# Patient Record
Sex: Male | Born: 1950 | Race: White | Hispanic: No | Marital: Married | Smoking: Former smoker
Health system: Southern US, Community
[De-identification: ages and names within clinical notes are randomized; demographics above are authoritative.]

## PROBLEM LIST (undated history)

## (undated) DIAGNOSIS — I10 Essential (primary) hypertension: Secondary | ICD-10-CM

## (undated) DIAGNOSIS — E785 Hyperlipidemia, unspecified: Secondary | ICD-10-CM

## (undated) HISTORY — DX: Essential (primary) hypertension: I10

## (undated) HISTORY — PX: LEG SURGERY: SHX1003

## (undated) HISTORY — DX: Hyperlipidemia, unspecified: E78.5

---

## 1987-08-01 HISTORY — PX: SPINE SURGERY: SHX786

## 2002-04-07 ENCOUNTER — Inpatient Hospital Stay (HOSPITAL_COMMUNITY): Admission: EM | Admit: 2002-04-07 | Discharge: 2002-04-08 | Payer: Self-pay | Admitting: Emergency Medicine

## 2007-09-24 ENCOUNTER — Encounter: Admission: RE | Admit: 2007-09-24 | Discharge: 2007-09-24 | Payer: Self-pay | Admitting: Neurosurgery

## 2007-10-11 ENCOUNTER — Inpatient Hospital Stay (HOSPITAL_COMMUNITY): Admission: RE | Admit: 2007-10-11 | Discharge: 2007-10-14 | Payer: Self-pay | Admitting: Neurosurgery

## 2008-10-21 ENCOUNTER — Emergency Department (HOSPITAL_COMMUNITY): Admission: EM | Admit: 2008-10-21 | Discharge: 2008-10-21 | Payer: Self-pay | Admitting: Emergency Medicine

## 2009-03-21 ENCOUNTER — Emergency Department (HOSPITAL_COMMUNITY): Admission: EM | Admit: 2009-03-21 | Discharge: 2009-03-21 | Payer: Self-pay | Admitting: Emergency Medicine

## 2010-02-28 IMAGING — CT CT PELVIS W/O CM
2 of 4 series · 16 of 46 positions shown, 18 images · non-contrast
Comparison: Acute abdomen series of same date.

CT ABDOMEN

CLINICAL DATA: Left flank pain for 3 days.  History of stones.

CT ABDOMEN AND PELVIS WITHOUT CONTRAST (CT UROGRAM)
TECHNIQUE: Contiguous axial images of the abdomen and pelvis
without oral or intravenous contrast were obtained.

[Series 2: stone <45 and or <(id) 5.0 mm · axial · 0.65mm/px · z∈[-474,-64]mm · 13 of 90 slices shown, 15 images]
[im 4/90  soft-tissue]
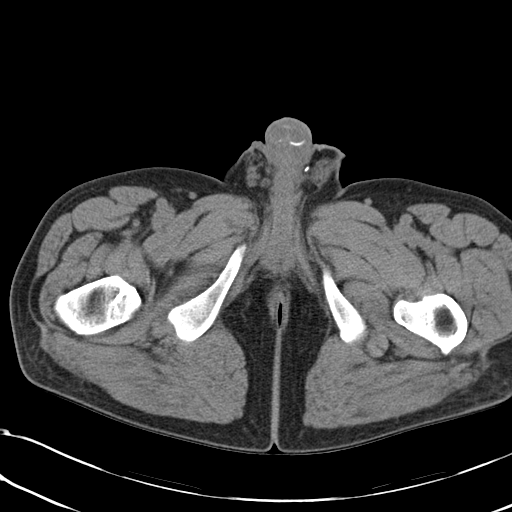
[im 4/90  bone]
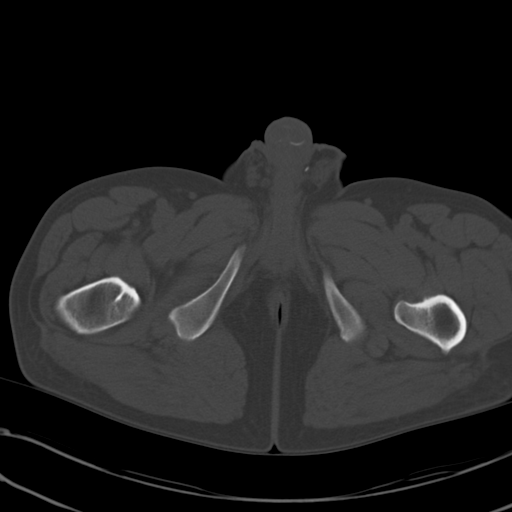
[im 11/90  soft-tissue]
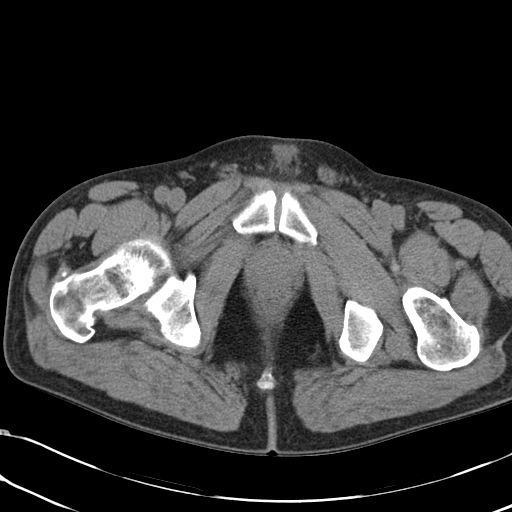
[im 18/90  soft-tissue]
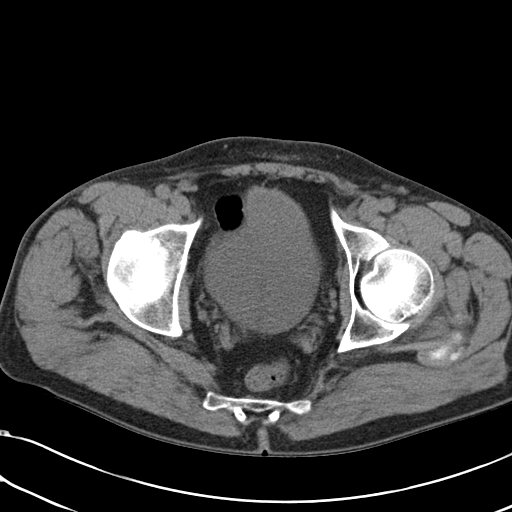
[im 24/90  soft-tissue]
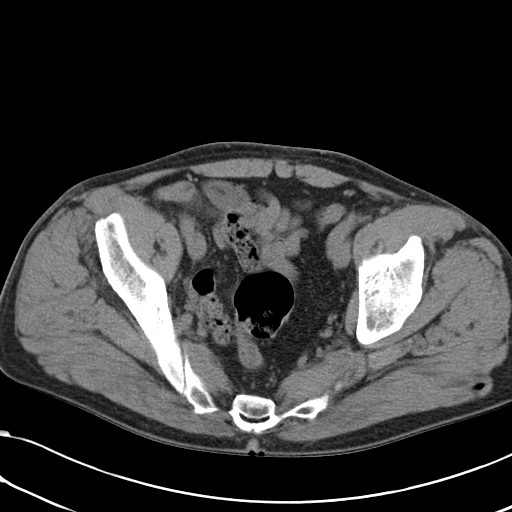
[im 31/90  soft-tissue]
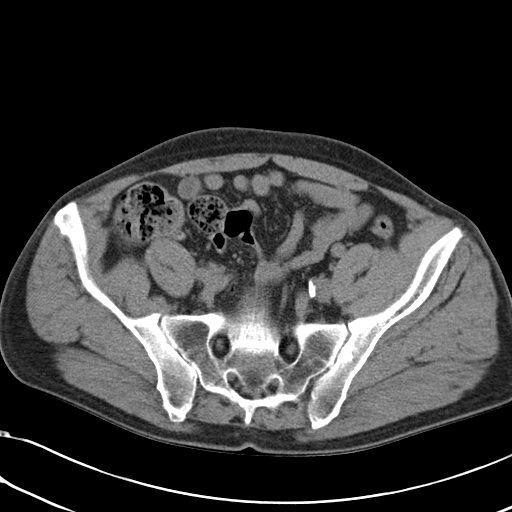
[im 38/90  soft-tissue]
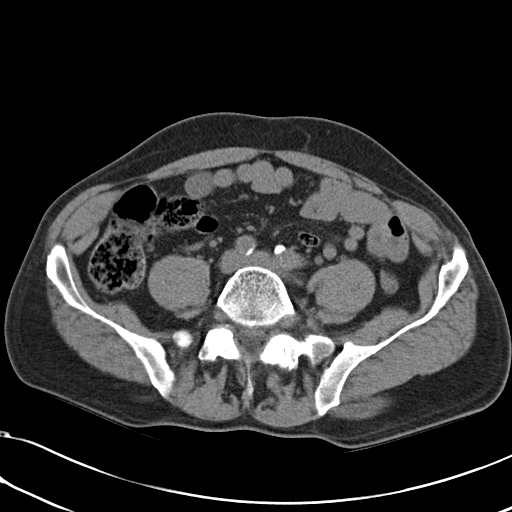
[im 45/90  soft-tissue]
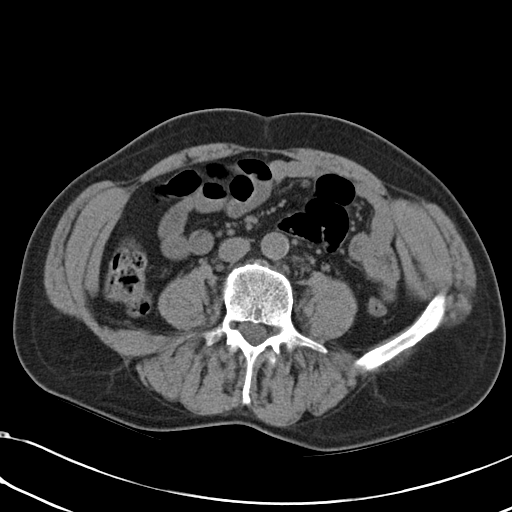
[im 52/90  soft-tissue]
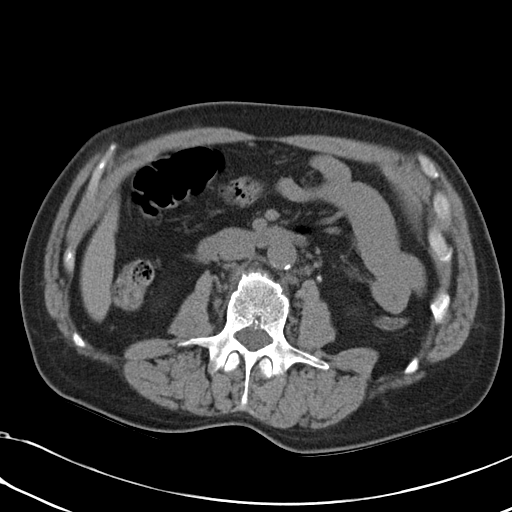
[im 59/90  soft-tissue]
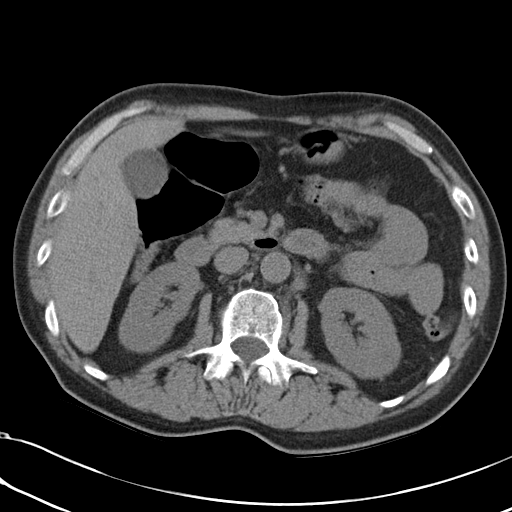
[im 59/90  bone]
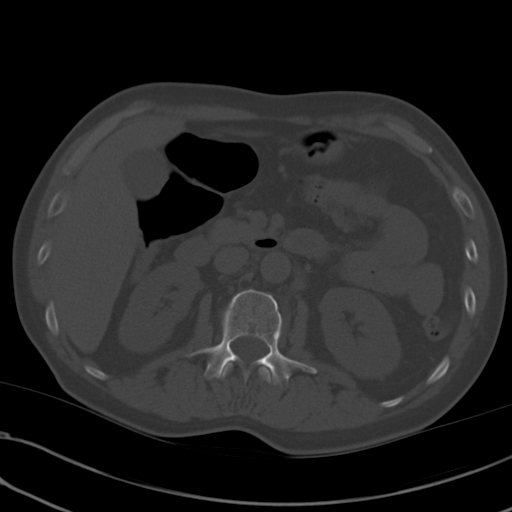
[im 66/90  soft-tissue]
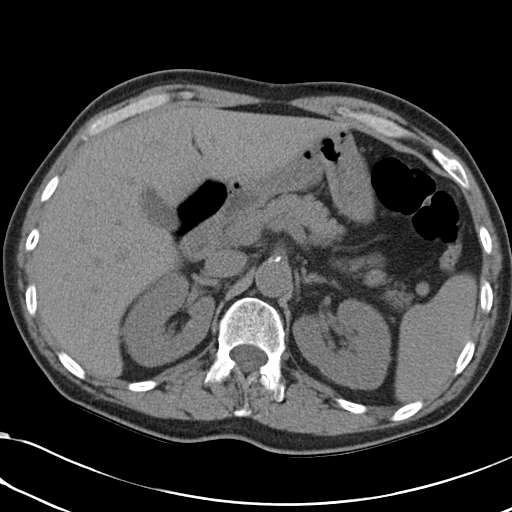
[im 72/90  soft-tissue]
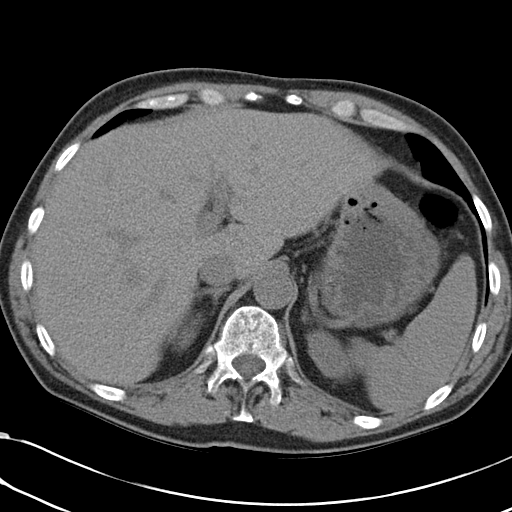
[im 79/90  soft-tissue]
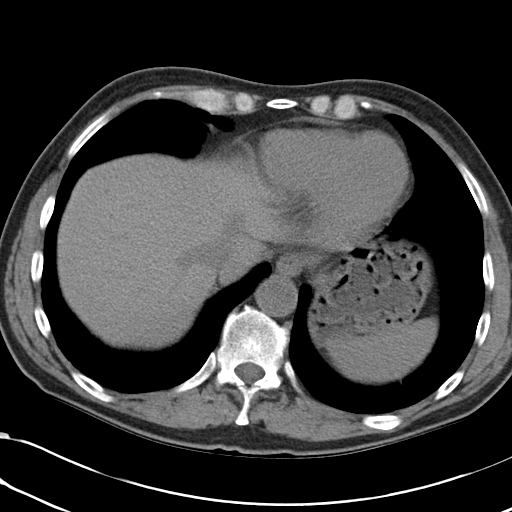
[im 86/90  soft-tissue]
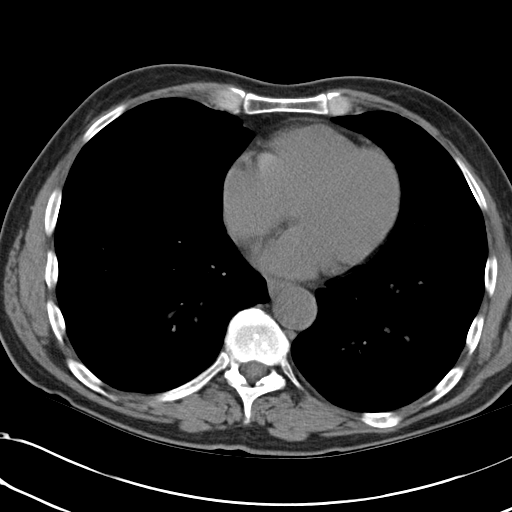

[Series 4: mpr coronal · coronal · 0.67mm/px · 3 of 81 slices shown]
[im 27/81  soft-tissue]
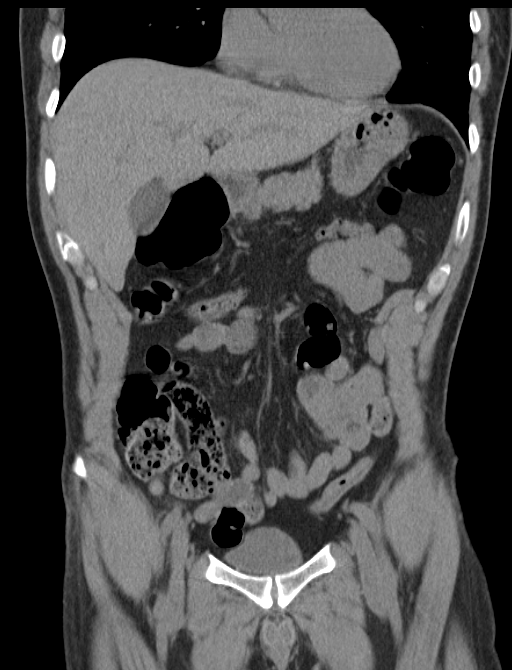
[im 36/81  soft-tissue]
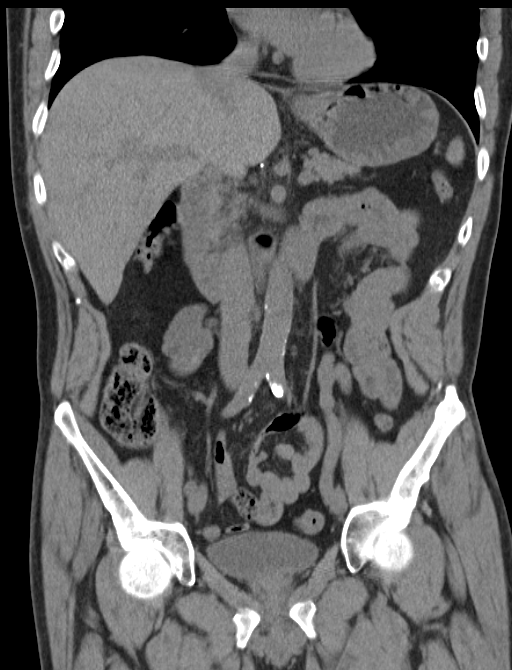
[im 45/81  soft-tissue]
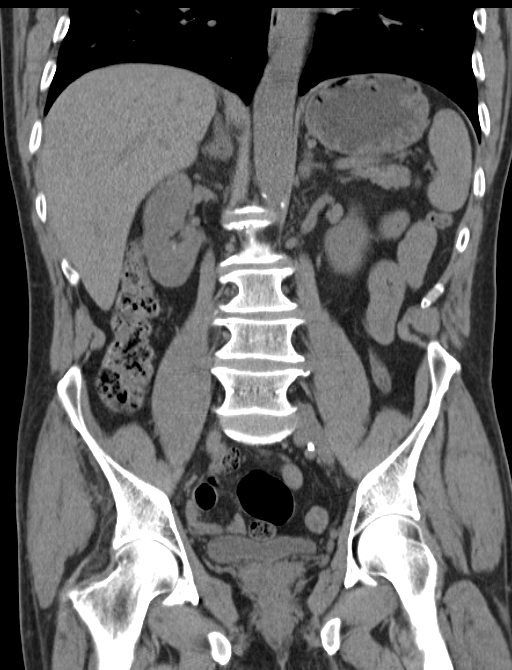

[16 of 46 positions shown; findings below may reference images not displayed]

FINDINGS: Exam is limited for evaluation of entities other than
urinary tract calculi due to lack of oral or intravenous contrast.

 Clear lung bases.  Normal heart size without pericardial or
pleural effusion.

Age advanced coronary artery atherosclerosis of the right coronary
artery image 7.

Normal uninfused appearance liver.  Splenule.  Normal stomach,
pancreas, gallbladder, biliary tract, adrenal glands.

Punctate lower pole right renal calculus, nonobstructive.

No significant obstruction.  A left proximal ureteric stone is most
apparent on coronal image 42.  3 mm.  Axial image 40.

No distal ureteric calculi.

Mildly prominent nodes in the retroperitoneum along the left para-
aortic station and adjacent the left ureter.  Image 47.  Most
likely reactive.

Normal colon, appendix, and terminal ileum.

Normal abdominal small bowel without ascites.
IMPRESSION: 1.  3 mm proximal left ureteric calculus without significant
hydronephrosis.
2.  Nonobstructive punctate right renal calculus.
3.  Age advanced coronary artery atherosclerosis.

CT PELVIS
FINDINGS: No distal urinary tract calculi or hydroureter.
Normal pelvic bowel loops.  Penile prosthesis. No pelvic
adenopathy.    Normal urinary bladder and prostate.  No ascites.
L3-L4, L4-L5 disc bulges incidentally noted.
IMPRESSION: No distal urinary tract calculi.

## 2010-07-31 HISTORY — PX: SPINE SURGERY: SHX786

## 2010-11-05 LAB — URINALYSIS, ROUTINE W REFLEX MICROSCOPIC
Bilirubin Urine: NEGATIVE
Nitrite: NEGATIVE
Specific Gravity, Urine: 1.02 (ref 1.005–1.030)
Urobilinogen, UA: 0.2 mg/dL (ref 0.0–1.0)

## 2010-11-05 LAB — CBC
MCHC: 36 g/dL (ref 30.0–36.0)
RBC: 3.67 MIL/uL — ABNORMAL LOW (ref 4.22–5.81)

## 2010-11-05 LAB — DIFFERENTIAL
Eosinophils Absolute: 0.1 10*3/uL (ref 0.0–0.7)
Eosinophils Relative: 1 % (ref 0–5)
Lymphs Abs: 1.7 10*3/uL (ref 0.7–4.0)
Monocytes Relative: 5 % (ref 3–12)

## 2010-11-05 LAB — COMPREHENSIVE METABOLIC PANEL
ALT: 18 U/L (ref 0–53)
AST: 31 U/L (ref 0–37)
CO2: 30 mEq/L (ref 19–32)
Calcium: 8.6 mg/dL (ref 8.4–10.5)
GFR calc Af Amer: >60 mL/min (ref >60)
GFR calc non Af Amer: 51 mL/min — ABNORMAL LOW (ref >60)
Potassium: 3.8 mEq/L (ref 3.5–5.1)
Sodium: 139 mEq/L (ref 135–145)
Total Protein: 6.3 g/dL (ref 6.0–8.3)

## 2010-11-10 LAB — BASIC METABOLIC PANEL
BUN: 13 mg/dL (ref 6–23)
Chloride: 104 mEq/L (ref 96–112)
Glucose, Bld: 78 mg/dL (ref 70–99)
Potassium: 3.6 mEq/L (ref 3.5–5.1)

## 2010-11-10 LAB — CBC
HCT: 35.8 % — ABNORMAL LOW (ref 39.0–52.0)
MCV: 94.7 fL (ref 78.0–100.0)
Platelets: 236 10*3/uL (ref 150–400)
RDW: 12.5 % (ref 11.5–15.5)
WBC: 5.9 10*3/uL (ref 4.0–10.5)

## 2010-11-10 LAB — DIFFERENTIAL
Eosinophils Absolute: 0 10*3/uL (ref 0.0–0.7)
Eosinophils Relative: 1 % (ref 0–5)
Lymphs Abs: 1.9 10*3/uL (ref 0.7–4.0)

## 2010-11-10 LAB — URINALYSIS, ROUTINE W REFLEX MICROSCOPIC
Bilirubin Urine: NEGATIVE
Glucose, UA: NEGATIVE mg/dL
Hgb urine dipstick: NEGATIVE
Ketones, ur: NEGATIVE mg/dL
pH: 5.5 (ref 5.0–8.0)

## 2010-12-13 NOTE — Op Note (Signed)
Larry Lowe, Larry Lowe                  ACCOUNT NO.:  [id number]   MEDICAL RECORD NO.:  [id number]          PATIENT TYPE:  INP   LOCATION:  2854                         FACILITY:  [facility]   PHYSICIAN:  Hilda Lias, M.D.   DATE OF BIRTH:  1950/11/13   DATE OF PROCEDURE:  10/11/2007  DATE OF DISCHARGE:                               OPERATIVE REPORT   PREOPERATIVE DIAGNOSIS:  Cervical spondylosis, C3-4, 4-5, 5-6 and 6-7,  with chronic radiculopathy, degenerative disk disease.   POSTOPERATIVE DIAGNOSIS:  Cervical spondylosis, C3-4, 4-5, 5-6 and 6-7,  with chronic radiculopathy, degenerative disk disease.   PROCEDURE:  Anterior 3-4, 4-5, 5-6 and 6-7 diskectomy, removal of a  large anterior osteophyte, decompression of the spinal cord,  foraminotomy, interbody fusion with allograft and autograft, plate from  C3 to C7, microscope.   SURGEON:  Hilda Lias, M.D.   ASSISTANT:  Payton Doughty, M.D.   CLINICAL HISTORY:  The patient had been seen in my office because of  back and neck pain.  He has weakness of both deltoids and biceps.  This  gentleman about 20 years ago underwent C7-T1 anterior cervical  diskectomy by me.  The patient has failed conservative treatment.  EMG  and nerve conduction were negative for carpal tunnel.  X-ray shows  spondylosis from C3 to C7.  Surgery was advised.   PROCEDURE:  The patient was taken to the OR and after intubation, the  left side of the neck was cleaned with DuraPrep.  A longitudinal  incision was made through the skin, subcutaneous tissue and platysma.  X-  rays showed indeed we were at the level of C4-C5.  From then on we  removed the anterior osteophyte at those 4 level.  The disk space was  quite narrow at the point that he had quite a bit of calcification of  the anterior ligament and the only way to get into the disk space was to  start drilling the anterior ligament; this procedure was done at the 4  level.  At the level of C3-C4, we found  degenerative disk disease with  central stenosis and bilateral foraminal compromise.  Decompression  after we opened the posterior ligament was also achieved.  The same  finding were found that the level of 4-5, 5-6 and 6-7.  At the level on  the left side, especially at the level of C5-C6, he had a herniated disk  with a large calcified disk.  After 2 hours, we were able to decompress  the spinal cord and all the nerve roots.  Having good decompression, the  endplates were drilled.  At the level of C3-C4, we inserted a lordotic  allograft with autograft in the middle of 6 mm.  At the C3 level, the  bone graft was of 7 mm.  From then on, a plate from C3 down to C7 was  used using 10 screws.  The patient has quite a bit of osteoporosis.  Out  of the 10 screws, we were able to insert eight screws.  Lateral cervical  spine showed good position of  the allograft and the plate.  The area was  irrigated.  Hemostasis was achieved, but nevertheless, we left a drain.  From then on, the wound was closed with Vicryl and Steri-Strips.  We are  going to send him to 3100 for at least overnight.  He will be wearing an  Aspen brace.           ______________________________  Hilda Lias, M.D.     EB/MEDQ  D:  10/11/2007  T:  10/13/2007  Job:  [id number]

## 2010-12-13 NOTE — Discharge Summary (Signed)
NAMEMARQUAVIS, Larry Lowe                  ACCOUNT NO.:  [id number]   MEDICAL RECORD NO.:  [id number]          PATIENT TYPE:  INP   LOCATION:  3005                         FACILITY:  [facility]   PHYSICIAN:  Hilda Lias, M.D.   DATE OF BIRTH:  21-Jun-1951   DATE OF ADMISSION:  10/11/2007  DATE OF DISCHARGE:  10/14/2007                               DISCHARGE SUMMARY   ADMISSION DIAGNOSIS:  Cervical spondylosis C3 to C6-C7 with chronic  radiculopathy.   FINAL DIAGNOSIS:  Cervical spondylosis C3 to C6-C7 with chronic  radiculopathy.   CLINICAL HISTORY:  The patient was admitted because of neck pain  radiating into both upper extremities,  associated with numbness, and  weakness. X-ray showed stenosis from C3 down to C6-C7. Surgery was  advised.   LABORATORY DATA:  Normal.   COURSE IN THE HOSPITAL:  The patient was taken to surgery. Decompression  from C3 down to C6-C7 was done. The patient had a drain. This was  removed this morning. He exhibited 5 strength, almost back to normal. He  is swallowing normally. He is ready to go home.   CONDITION AT DISCHARGE:  Improving.   MEDICATIONS:  1. Percocet.  2. Diazepam.   DIET:  Regular.   ACTIVITY:  Not to drive for at least a week.   FOLLOWUP:  To be seen by me in four weeks or before p.r.n.           ______________________________  Hilda Lias, M.D.     EB/MEDQ  D:  10/14/2007  T:  10/14/2007  Job:  [id number]

## 2010-12-13 NOTE — H&P (Signed)
NAMEGRANVILLE, WHITEFIELD                  ACCOUNT NO.:  [id number]   MEDICAL RECORD NO.:  [id number]          PATIENT TYPE:  INP   LOCATION:  3306                         FACILITY:  [facility]   PHYSICIAN:  Hilda Lias, M.D.   DATE OF BIRTH:  07-25-1951   DATE OF ADMISSION:  10/11/2007  DATE OF DISCHARGE:                              HISTORY & PHYSICAL   [name] is a gentleman who was seen in my office because of neck pain  associated with numbness and weakness in both hands, left worse than  right.  Sometime he had difficulty grabbing a cup of coffee.  He is  quite miserable.  We did an x-ray, including EMG and nerve conduction  tests, and because of the findings he is being admitted for surgery.  This gentleman about 20 years ago underwent fusion __________  C7-T1.   PAST MEDICAL HISTORY:  Anterior cervical fusion of C7-T1 twenty years  ago.  He is not allergic to medication.  He smoked 2 packs a day.  He  drinks wine socially.   FAMILY HISTORY:  Unremarkable.   REVIEW OF SYSTEMS:  Positive for neck pain, arm pain, weakness, high  cholesterol.   PHYSICAL EXAMINATION:  HEENT:  Normal.  NECK:  He has a scar anteriorly.  He has decreased flexibility of  cervical spine.  LUNGS:  There are some mild rhonchi bilaterally.  CARDIOVASCULAR:  Normal.  ABDOMEN:  Normal.  EXTREMITIES:  There is a scar on the right leg from an injury many years  ago.   PHYSICAL EXAMINATION:  Mental status normal.  Cranial nerves normal.  Strength:  __________  both deltoids, both biceps and __________ .  There is a question of weakness on the left triceps.  __________ .  No  Babinski.  Coordination and gait normal.   The MRI showed cervical stenosis with foraminal narrowing at the C3-4, 4-  5, 5-6 and borderline C6-7.  The EMG and nerve conduction tests were  normal.   CLINICAL IMPRESSION:  Cervical spondylosis, C3 to C5-6, question C6-7.   RECOMMENDATIONS:  The patient is being admitted for surgery.   The  procedure will be anterior decompression of C3-4, 4-5 and 5-6 and  possible C6-7.  The surgery was explained to him, is essentially the  same that he had many years ago at the level of C7-T1.  The risk of  __________ infection, CSF leak, worsening pain, paralysis, need for  further surgery.           ______________________________  Hilda Lias, M.D.    EB/MEDQ  D:  10/11/2007  T:  10/12/2007  Job:  [id number]

## 2010-12-16 NOTE — H&P (Signed)
Larry Lowe, Larry Lowe                            ACCOUNT NO.:  [id number]   MEDICAL RECORD NO.:  [id number]                   PATIENT TYPE:  EMS   LOCATION:  MAJO                                 FACILITY:  [facility]   PHYSICIAN:  Gerrit Friends. Dietrich Pates, M.D. [facility]        DATE OF BIRTH:  Nov 23, 1950   DATE OF ADMISSION:  04/07/2002  DATE OF DISCHARGE:                                HISTORY & PHYSICAL   REFERRING PHYSICIAN:  Dr. Dimple Casey, [facility]   HISTORY OF PRESENT ILLNESS:  The patient is a 60 year old gentleman in  generally good health who presents with prolonged chest discomfort.  The  patient has had minor intermittent chest discomfort in the past that he  attributed to heart burn.  He has no known cardiac history.  He has never  been seen by a cardiologist nor undergone any cardiac testing.  This  afternoon at lunch the patient developed a stinging lower substernal  discomfort that was pleuritic in nature.  He did not have true dyspnea, but  felt that he had to take a deep breath now and again.  There was no  radiation to the discomfort.  Nothing has made it better or worse.  There  was no associated diaphoresis or nausea.  The patient reports no recent  unaccustomed exertion or potential injury.  Sublingual nitroglycerin was  given in his physician's office with no relief.  [facility]   PAST MEDICAL HISTORY:  Notable for hypertension that has been well  controlled with medical therapy.  He has dyslipidemia. he also has  osteoarthritis, gastroesophageal reflux disease and discoid lupus.   PAST SURGICAL HISTORY:  Has included a procedure on his cervical spine.   CURRENT MEDICATIONS:  1. Pravastatin 40 mg q.d.  2. Aciphex 20 mg q.d.  3. Aspirin.   ALLERGIES:  He has no known allergies.   SOCIAL HISTORY:  The patient lives in [place] with his wife.  He  works as a Engineer, maintenance.  He  had a substantial history of  cigarette smoking, approximately 75 Mariner years, but quit one year ago.  No  excessive use of alcohol.  He has the opportunity for considerable physical  activity in his work.   FAMILY HISTORY:  No significant cardiovascular disease.   REVIEW OF SYMPTOMS:  Occasionally has arthralgias in his neck; all other  systems negative.   PHYSICAL EXAMINATION:  GENERAL:  Tanned, pleasant, well-appearing gentleman  in no acute distress.  VITAL SIGNS:  Temperature 97.4, heart rate 68 and regular, respirations 15,  blood pressure 115/80.  Oxygen saturation 97% on room air.  HEENT:  Anicteric sclerae.  NECK:  No jugular venous distention; no carotid bruits.  LUNGS:  Clear.  CARDIOVASCULAR:  Normal first and second heart sounds.  ABDOMEN: Soft, nontender;  no organomegaly.  EXTREMITIES:  No edema.  Normal distal pulses.  NEUROMUSCULAR:  Symmetric strength and tone.   LABORATORY DATA:  Chest x-ray:  NAD.   Electrocardiogram normal sinus rhythm; within normal limits.   Remainder of the laboratory studies are pending.   IMPRESSION AND PLANS:  A 60 year old gentleman with moderate cardiovascular  risk, atypical symptoms, benign examination and a negative  electrocardiogram.  The characteristics of his discomfort sound more related  to chest wall issues than cardiac.  Observation status to exclude myocardial  injury is appropriate.  A graded exercise test in the morning, if negative,  will provide adequate reassurance that he does not have significant coronary  disease.  He will be treated by continuing his PPI and adding an anti-  inflammatory agent.  A small dose of beta blocker and aspirin will also be  given.                                               Gerrit Friends. Dietrich Pates, M.D. [facility]    RMR/MEDQ  D:  04/07/2002  T:  04/08/2002  Job:  [id number]

## 2010-12-16 NOTE — Discharge Summary (Addendum)
NAMEJOHNCARLO, MAALOUF                            ACCOUNT NO.:  [id number]   MEDICAL RECORD NO.:  [id number]                   PATIENT TYPE:  INP   LOCATION:  2904                                 FACILITY:  [facility]   PHYSICIAN:  Lavella Hammock, P.A. [facility]            DATE OF BIRTH:  28-Oct-1950   DATE OF ADMISSION:  04/07/2002  DATE OF DISCHARGE:                           DISCHARGE SUMMARY - REFERRING   PROCEDURE:  Exercise treadmill.   HOSPITAL COURSE:  Mr. Ambrosino is a 60 year old male with no known history of  coronary artery disease who went to his primary medical doctor's office  because of the onset of substernal chest pain in the xiphoid area described  as stinging at times, but also as just a regular pain.  He stated that  this became worse with deep inspiration and was a 3/10 at its worst.  It was  associated with shortness of breath.  It was also worse with palpation of  the xiphoid process.  He had never had these symptoms before.  They did not  occur while he was exerting himself or after any recent exertion or trauma.  He had some prior episodes of chest pain that were nothing like this and  were relieved by Alka-Seltzer.  He is normally active and farms and has no  symptoms.  He was admitted to rule out MI and for further evaluation.   Nitroglycerin was no real help, and the patient had been on Aciphex for  reflux disease prior to admission.  Because of his chest wall tenderness, he  was started on Motrin and because of the possibility that reflux was  contributing to his symptoms, he was changed from Aciphex to Nexium for  proton-pump inhibitor.   His enzymes were negative for MI, and the next day he had a gated exercise  treadmill.  The exercise treadmill was performed under the Bruce protocol,  and he completed stage II.  His goal heart rate was 144, and he reached a  maximum heart rate of 153.  He reached his target heart rate during stage I  and continued it during stage  II.  During the treadmill, he had no chest  pain.  He had some mild shortness of breath but the exercise-limiting factor  was leg fatigue.  His EKG had some mild upsloping ST segment changes but  there were no obvious ischemic changes.  The EKGs were reviewed by Dr. Olga Millers who agreed and felt there was no ischemia on the EKGs during the  treadmill.  He had no chest pain during the test.   As part of his workup, he had a D-dimer checked to evaluate for PE and this  was negative.  Additionally, he had an amylase and lipase performed which  were also within normal limits.  The patient had no abdominal tenderness or  signs of acute abdomen.   With resolution  of his symptoms on the oral medications and enzymes negative  for MI as well at the treadmill negative for ischemia, he was considered  stable for discharge on 04/08/02.   LABORATORY DATA:  Hemoglobin 13.1, hematocrit 37.7, WBC 6.9, platelets  231,000.  Sodium 141, potassium 4.0, chloride 108, CO2 29, BUN 16,  creatinine 1.0, glucose 96.  D-dimer less than 0.22.  Amylase 127, lipase  32.  Serial CKMB and ____ QA MARKER: 199 ____ are n   CONDITION ON DISCHARGE:  Stable.   DISCHARGE DIAGNOSES:  1. Chest pain, D-Dimer negative for ____ QA MARKER: 220 ____, enzy     negative for Cardiolite, and gated exercise tolerance test negative for     ischemia, amylase and lipase negative for acute gallbladder disease,     followup with primary care.  2. Elevated liver function tests.  3. Hyperlipidemia.  4. Borderline hypertension.  5. History of lupus.  6. History of osteoarthritis.  7. History of gastroesophageal reflux disease symptoms.  8. Status post back surgery and C-spine surgery.   DISCHARGE INSTRUCTIONS:  1. Present Coumadin levels to be as tolerated.  2. Stick to a low-fat diet.   FOLLOW UP:  He is to followup with Dr. Dietrich Pates on a p.r.n. basis.   DISCHARGE MEDICATIONS:  1. Coated aspirin 81 mg q.d.  2.  Pravachol 40 mg q.d.  3. Aciphex 20 mg q.d. unless on Nexium.  4. Motrin 600 mg p.o. t.i.d. x one week.  5. Nexium 40 mg p.o. q.d. while on Motrin.                                               Lavella Hammock, P.A. [facility]    RG/MEDQ  D:  04/08/2002  T:  04/09/2002  Job:  [id number]   cc:   Magnus Sinning. Dimple Casey, M.D.  [address] [place]  [place] [address]  Fax: [phone]   Gerrit Friends. Dietrich Pates, M.D. [facility]  [address]  [place]  [place] [address]  Fax: [phone]

## 2011-04-24 LAB — CBC
HCT: 42.1
Hemoglobin: 14.6
MCHC: 34.6
MCV: 96.2
Platelets: 233
RBC: 4.38
RDW: 14.3
WBC: 8.6

## 2011-04-24 LAB — BASIC METABOLIC PANEL
BUN: 12
CO2: 28
Calcium: 9.5
Chloride: 104
Creatinine, Ser: 0.82
GFR calc Af Amer: >60
GFR calc non Af Amer: >60
Glucose, Bld: 102 — ABNORMAL HIGH
Potassium: 4.1
Sodium: 142

## 2016-04-28 ENCOUNTER — Ambulatory Visit (INDEPENDENT_AMBULATORY_CARE_PROVIDER_SITE_OTHER): Payer: Medicare Other | Admitting: Family Medicine

## 2016-04-28 ENCOUNTER — Encounter: Payer: Self-pay | Admitting: Family Medicine

## 2016-04-28 VITALS — BP 172/93 | HR 68 | Temp 97.3°F | Ht 66.0 in | Wt 167.0 lb

## 2016-04-28 DIAGNOSIS — M25512 Pain in left shoulder: Secondary | ICD-10-CM | POA: Diagnosis not present

## 2016-04-28 DIAGNOSIS — I10 Essential (primary) hypertension: Secondary | ICD-10-CM

## 2016-04-28 DIAGNOSIS — Z7189 Other specified counseling: Secondary | ICD-10-CM

## 2016-04-28 DIAGNOSIS — M25511 Pain in right shoulder: Secondary | ICD-10-CM | POA: Diagnosis not present

## 2016-04-28 DIAGNOSIS — Z7689 Persons encountering health services in other specified circumstances: Secondary | ICD-10-CM

## 2016-04-28 LAB — CMP14+EGFR
A/G RATIO: 1.5 (ref 1.2–2.2)
ALK PHOS: 80 IU/L (ref 39–117)
ALT: 23 IU/L (ref 0–44)
AST: 20 IU/L (ref 0–40)
Albumin: 4.3 g/dL (ref 3.6–4.8)
BILIRUBIN TOTAL: 0.4 mg/dL (ref 0.0–1.2)
BUN/Creatinine Ratio: 14 (ref 10–24)
BUN: 16 mg/dL (ref 8–27)
CHLORIDE: 96 mmol/L (ref 96–106)
CO2: 26 mmol/L (ref 18–29)
Calcium: 9.5 mg/dL (ref 8.6–10.2)
Creatinine, Ser: 1.15 mg/dL (ref 0.76–1.27)
GFR calc non Af Amer: 66 mL/min/{1.73_m2} (ref >59)
GFR, EST AFRICAN AMERICAN: 77 mL/min/{1.73_m2} (ref >59)
Globulin, Total: 2.8 g/dL (ref 1.5–4.5)
Glucose: 94 mg/dL (ref 65–99)
POTASSIUM: 5.2 mmol/L (ref 3.5–5.2)
Sodium: 136 mmol/L (ref 134–144)
Total Protein: 7.1 g/dL (ref 6.0–8.5)

## 2016-04-28 LAB — LIPID PANEL
Chol/HDL Ratio: 5.2 ratio units — ABNORMAL HIGH (ref 0.0–5.0)
Cholesterol, Total: 240 mg/dL — ABNORMAL HIGH (ref 100–199)
HDL: 46 mg/dL (ref >39)
LDL Calculated: 170 mg/dL — ABNORMAL HIGH (ref 0–99)
Triglycerides: 120 mg/dL (ref 0–149)
VLDL Cholesterol Cal: 24 mg/dL (ref 5–40)

## 2016-04-28 MED ORDER — MELOXICAM 15 MG PO TABS: 15.0000 mg | ORAL_TABLET | Freq: Every day | ORAL | 0 refills | Status: DC

## 2016-04-28 MED ORDER — IRBESARTAN 150 MG PO TABS: 150.0000 mg | ORAL_TABLET | Freq: Every day | ORAL | 6 refills | Status: DC

## 2016-04-28 MED ORDER — IRBESARTAN 75 MG PO TABS: 150.0000 mg | ORAL_TABLET | Freq: Every day | ORAL | Status: DC

## 2016-04-28 NOTE — Progress Notes (Signed)
   Subjective:    Patient ID: Larry Lowe, male    DOB: January 08, 1951, 65 y.o.   MRN: [id number]  HPI Patient here today to establish care and discuss several issues.  Patient was formally a patient here greater than 10 years ago. Generally he has stayed away from doctors offices because he did not have any health insurance but now has Medicare. He has had a history of hypertension and hyperlipidemia in the past. He has greater than 50-Baena-year cigarette smoking. Only complaints today are bilateral shoulder pain right greater than left. 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 There is no family history of cancer or diabetes. Generally he feels well.   There are no active problems to display for this patient.  No outpatient encounter prescriptions on file as of 04/28/2016.   No facility-administered encounter medications on file as of 04/28/2016.        Review of Systems  Constitutional: Negative.   HENT: Negative.   Eyes: Negative.   Respiratory: Negative.   Cardiovascular: Negative.   Gastrointestinal: Negative.   Endocrine: Negative.   Genitourinary: Negative.   Musculoskeletal: Positive for arthralgias (bilateral shoulder pain).  Skin: Negative.   Allergic/Immunologic: Negative.   Neurological: Negative.   Hematological: Negative.   Psychiatric/Behavioral: Negative.        Objective:   Physical Exam  Constitutional: He is oriented to person, place, and time. He appears well-developed and well-nourished.  HENT:  Head: Normocephalic.  Eyes: Pupils are equal, round, and reactive to light.  Cardiovascular: Normal rate, regular rhythm, normal heart sounds and intact distal pulses.   Pulmonary/Chest: Effort normal and breath sounds normal.  Abdominal: Soft. There is no tenderness.  Musculoskeletal:  Shoulders: Normal range of motion. Tenderness over the deltoid tendon  insertion  Neurological: He is alert and oriented to person, place, and time.  Psychiatric: He has a normal mood and affect. His behavior is normal.   BP (!) 172/93 (BP Location: Left Arm)   Pulse 68   Temp 97.3 F (36.3 C) (Oral)   Ht '5\' 6"'$  (1.676 m)   Wt 167 lb (75.8 kg)   BMI 26.95 kg/m         Assessment & Plan:  1. Encounter to establish care Exam is basically unremarkable. There is some deltoid tendon tenderness consistent with tendinitis or bursitis - CMP14+EGFR - Lipid panel  2. Essential hypertension Begin patient on an ARB, irbesartan - CMP14+EGFR - Lipid panel  3. Bilateral shoulder pain Refill Mobic at patient request. Encouraged good fluid intake  Wardell Honour MD

## 2016-04-28 NOTE — Addendum Note (Signed)
Addended by: Magdalene RiverBULLINS, Cheralyn Oliver H on: 04/28/2016 11:36 AM   Modules accepted: Orders

## 2016-04-28 NOTE — Patient Instructions (Signed)
Medicare Annual Wellness Visit  [place] and the medical providers at [facility] strive to bring you the best medical care.  In doing so we not only want to address your current medical conditions and concerns but also to detect new conditions early and prevent illness, disease and health-related problems.    Medicare offers a yearly Wellness Visit which allows our clinical staff to assess your need for preventative services including immunizations, lifestyle education, counseling to decrease risk of preventable diseases and screening for fall risk and other medical concerns.    This visit is provided free of charge (no copay) for all Medicare recipients. The clinical pharmacists at [facility] have begun to conduct these Wellness Visits which will also include a thorough review of all your medications.    As you primary medical provider recommend that you make an appointment for your Annual Wellness Visit if you have not done so already this year.  You may set up this appointment before you leave today or you may call back ([phone]) and schedule an appointment.  Please make sure when you call that you mention that you are scheduling your Annual Wellness Visit with the clinical pharmacist so that the appointment may be made for the proper length of time.     Continue current medications. Continue good therapeutic lifestyle changes which include good diet and exercise. Fall precautions discussed with patient. If an FOBT was given today- please return it to our front desk. If you are over 50 years old - you may need Prevnar 13 or the adult Pneumonia vaccine.  **Flu shots are available--- please call and schedule a FLU-CLINIC appointment**  After your visit with us today you will receive a survey in the mail or online from [employer] regarding your care with us. Please take a moment to fill this out. Your feedback is very  important to us as you can help us better understand your patient needs as well as improve your experience and satisfaction. WE CARE ABOUT YOU!!!    

## 2016-05-08 ENCOUNTER — Telehealth: Payer: Self-pay | Admitting: Family Medicine

## 2016-05-08 NOTE — Telephone Encounter (Signed)
Reduce the dose by one half until Dr. Hyacinth MeekerMiller returns and he can discuss the issues he is having with him at that time.

## 2016-05-08 NOTE — Telephone Encounter (Signed)
Please advise on medication causing headache and sickness.

## 2016-05-09 NOTE — Telephone Encounter (Signed)
Would he be willing to come in for a recheck to see the about the blood pressure and talk about the medicines

## 2016-05-10 NOTE — Telephone Encounter (Signed)
Phone not accepting messages. Will try again later.

## 2016-05-15 NOTE — Telephone Encounter (Signed)
Pt called in to get lab results. Also stated that Dr. Hyacinth MeekerMiller had told him that his BP med would make him feel bad at first. He is now taking it at night and feels better

## 2016-05-24 ENCOUNTER — Other Ambulatory Visit: Payer: Self-pay | Admitting: Family Medicine

## 2016-08-03 ENCOUNTER — Other Ambulatory Visit: Payer: Self-pay | Admitting: Family Medicine

## 2016-08-04 ENCOUNTER — Encounter: Payer: Self-pay | Admitting: Family Medicine

## 2016-08-04 ENCOUNTER — Ambulatory Visit (INDEPENDENT_AMBULATORY_CARE_PROVIDER_SITE_OTHER): Payer: Medicare Other | Admitting: Family Medicine

## 2016-08-04 VITALS — BP 207/110 | HR 74 | Temp 97.0°F | Ht 66.0 in | Wt 175.0 lb

## 2016-08-04 DIAGNOSIS — I10 Essential (primary) hypertension: Secondary | ICD-10-CM

## 2016-08-04 MED ORDER — IRBESARTAN 300 MG PO TABS: 300.0000 mg | ORAL_TABLET | Freq: Every day | ORAL | 5 refills | Status: AC

## 2016-08-04 NOTE — Patient Instructions (Signed)
Medicare Annual Wellness Visit  [place] and the medical providers at [facility] strive to bring you the best medical care.  In doing so we not only want to address your current medical conditions and concerns but also to detect new conditions early and prevent illness, disease and health-related problems.    Medicare offers a yearly Wellness Visit which allows our clinical staff to assess your need for preventative services including immunizations, lifestyle education, counseling to decrease risk of preventable diseases and screening for fall risk and other medical concerns.    This visit is provided free of charge (no copay) for all Medicare recipients. The clinical pharmacists at [facility] have begun to conduct these Wellness Visits which will also include a thorough review of all your medications.    As you primary medical provider recommend that you make an appointment for your Annual Wellness Visit if you have not done so already this year.  You may set up this appointment before you leave today or you may call back [phone]) and schedule an appointment.  Please make sure when you call that you mention that you are scheduling your Annual Wellness Visit with the clinical pharmacist so that the appointment may be made for the proper length of time.  ,  Continue current medications. Continue good therapeutic lifestyle changes which include good diet and exercise. Fall precautions discussed with patient. If an FOBT was given today- please return it to our front desk. If you are over 50 years old - you may need Prevnar 13 or the adult Pneumonia vaccine.  **Flu shots are available--- please call and schedule a FLU-CLINIC appointment**  After your visit with us today you will receive a survey in the mail or online from [employer] regarding your care with us. Please take a moment to fill this out. Your feedback is very  important to us as you can help us better understand your patient needs as well as improve your experience and satisfaction. WE CARE ABOUT YOU!!!    

## 2016-08-04 NOTE — Progress Notes (Signed)
   Subjective:    Patient ID: Larry Lowe, male    DOB: Nov 18, 1950, 66 y.o.   MRN: [id number]  HPI Pt here for follow up and management of chronic medical problems which includes hypertension. He is taking meds regularly. Blood pressure is higher than it was at his last visit. He is taking irbesartan 150 mg daily. He tells me that when his wife checks it at home it is normal. He denies any chest pain shortness of breath headache dizziness.   Patient Active Problem List   Diagnosis Date Noted  . Essential hypertension 04/28/2016   Outpatient Encounter Prescriptions as of 08/04/2016  Medication Sig  . irbesartan (AVAPRO) 150 MG tablet Take 1 tablet (150 mg total) by mouth daily.  . meloxicam (MOBIC) 15 MG tablet TAKE ONE TABLET BY MOUTH ONCE DAILY   No facility-administered encounter medications on file as of 08/04/2016.       Review of Systems  Constitutional: Negative.   HENT: Negative.   Eyes: Negative.   Respiratory: Negative.   Cardiovascular: Negative.   Gastrointestinal: Negative.   Endocrine: Negative.   Genitourinary: Negative.   Musculoskeletal: Negative.   Skin: Negative.   Allergic/Immunologic: Negative.   Neurological: Negative.   Hematological: Negative.   Psychiatric/Behavioral: Negative.        Objective:   Physical Exam  Constitutional: He is oriented to person, place, and time. He appears well-developed and well-nourished.  Cardiovascular: Normal rate, regular rhythm and normal heart sounds.   Pulmonary/Chest: Effort normal and breath sounds normal.  Neurological: He is alert and oriented to person, place, and time.  Psychiatric: He has a normal mood and affect. His behavior is normal.   BP (!) 207/110   Pulse 74   Temp 97 F (36.1 C) (Oral)   Ht 5\' 6"  (1.676 m)   Wt 175 lb (79.4 kg)   BMI 28.25 kg/m         Assessment & Plan:  1. Essential hypertension Blood pressure is not at goal. Will increase irbesartan to 300 mg per day and continue  monitoring. Still elevated at next visit would add diarrhetic to the ARB Frederica KusterStephen M Miller MD

## 2016-08-15 ENCOUNTER — Telehealth: Payer: Self-pay | Admitting: *Deleted

## 2016-08-15 DIAGNOSIS — I469 Cardiac arrest, cause unspecified: Secondary | ICD-10-CM | POA: Diagnosis not present

## 2016-08-16 NOTE — Telephone Encounter (Signed)
I can sign but as you can see from his only OV, no diseases identified

## 2016-08-31 NOTE — Telephone Encounter (Signed)
Incoming call from [facility] Arrived on scene at pt's home Pt was found pulseless, not breathing CPR was performed Pt remained in asystole Would like for Dr Hyacinth MeekerMiller to sign death certificate

## 2016-08-31 DEATH — deceased

## 2016-11-03 ENCOUNTER — Ambulatory Visit: Payer: Medicare Other | Admitting: Family Medicine
